# Patient Record
Sex: Female | Born: 1960 | Race: Black or African American | Hispanic: No
Health system: Southern US, Community
[De-identification: ages and names within clinical notes are randomized; demographics above are authoritative.]

---

## 2010-12-20 ENCOUNTER — Emergency Department (HOSPITAL_BASED_OUTPATIENT_CLINIC_OR_DEPARTMENT_OTHER)
Admission: EM | Admit: 2010-12-20 | Discharge: 2010-12-20 | Disposition: A | Discharge status: Home / Self Care | Payer: Managed Care, Other (non HMO) | Attending: Emergency Medicine | Admitting: Emergency Medicine

## 2010-12-20 ENCOUNTER — Emergency Department (INDEPENDENT_AMBULATORY_CARE_PROVIDER_SITE_OTHER): Payer: Managed Care, Other (non HMO)

## 2010-12-20 DIAGNOSIS — Z79899 Other long term (current) drug therapy: Secondary | ICD-10-CM | POA: Insufficient documentation

## 2010-12-20 DIAGNOSIS — M25569 Pain in unspecified knee: Secondary | ICD-10-CM

## 2010-12-20 DIAGNOSIS — I10 Essential (primary) hypertension: Secondary | ICD-10-CM | POA: Insufficient documentation

## 2010-12-20 DIAGNOSIS — F341 Dysthymic disorder: Secondary | ICD-10-CM | POA: Insufficient documentation

## 2012-02-12 IMAGING — CR DG KNEE 1-2V*L*
3 series · 3 of 3 positions shown · non-contrast
Comparison: None.

CLINICAL DATA: Knee pain, no known injury

LEFT KNEE - 1-2 VIEW

[t knee ap left]
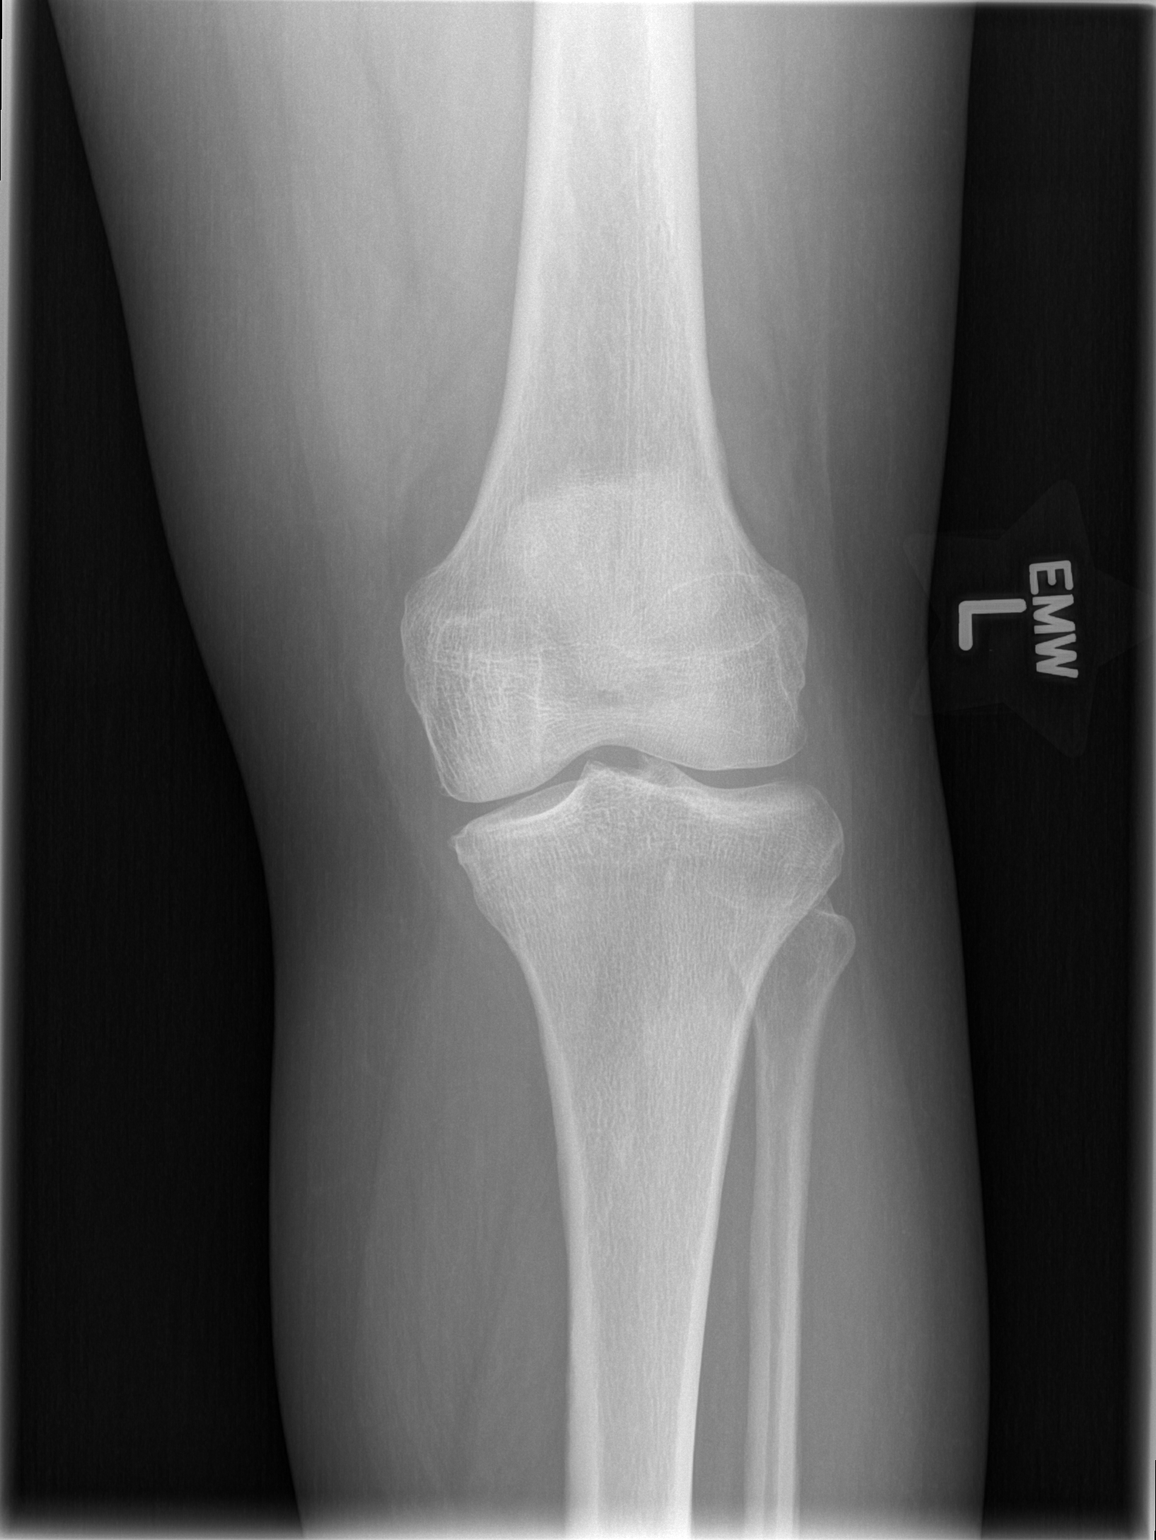

[t knee lat left (1 of 2)]
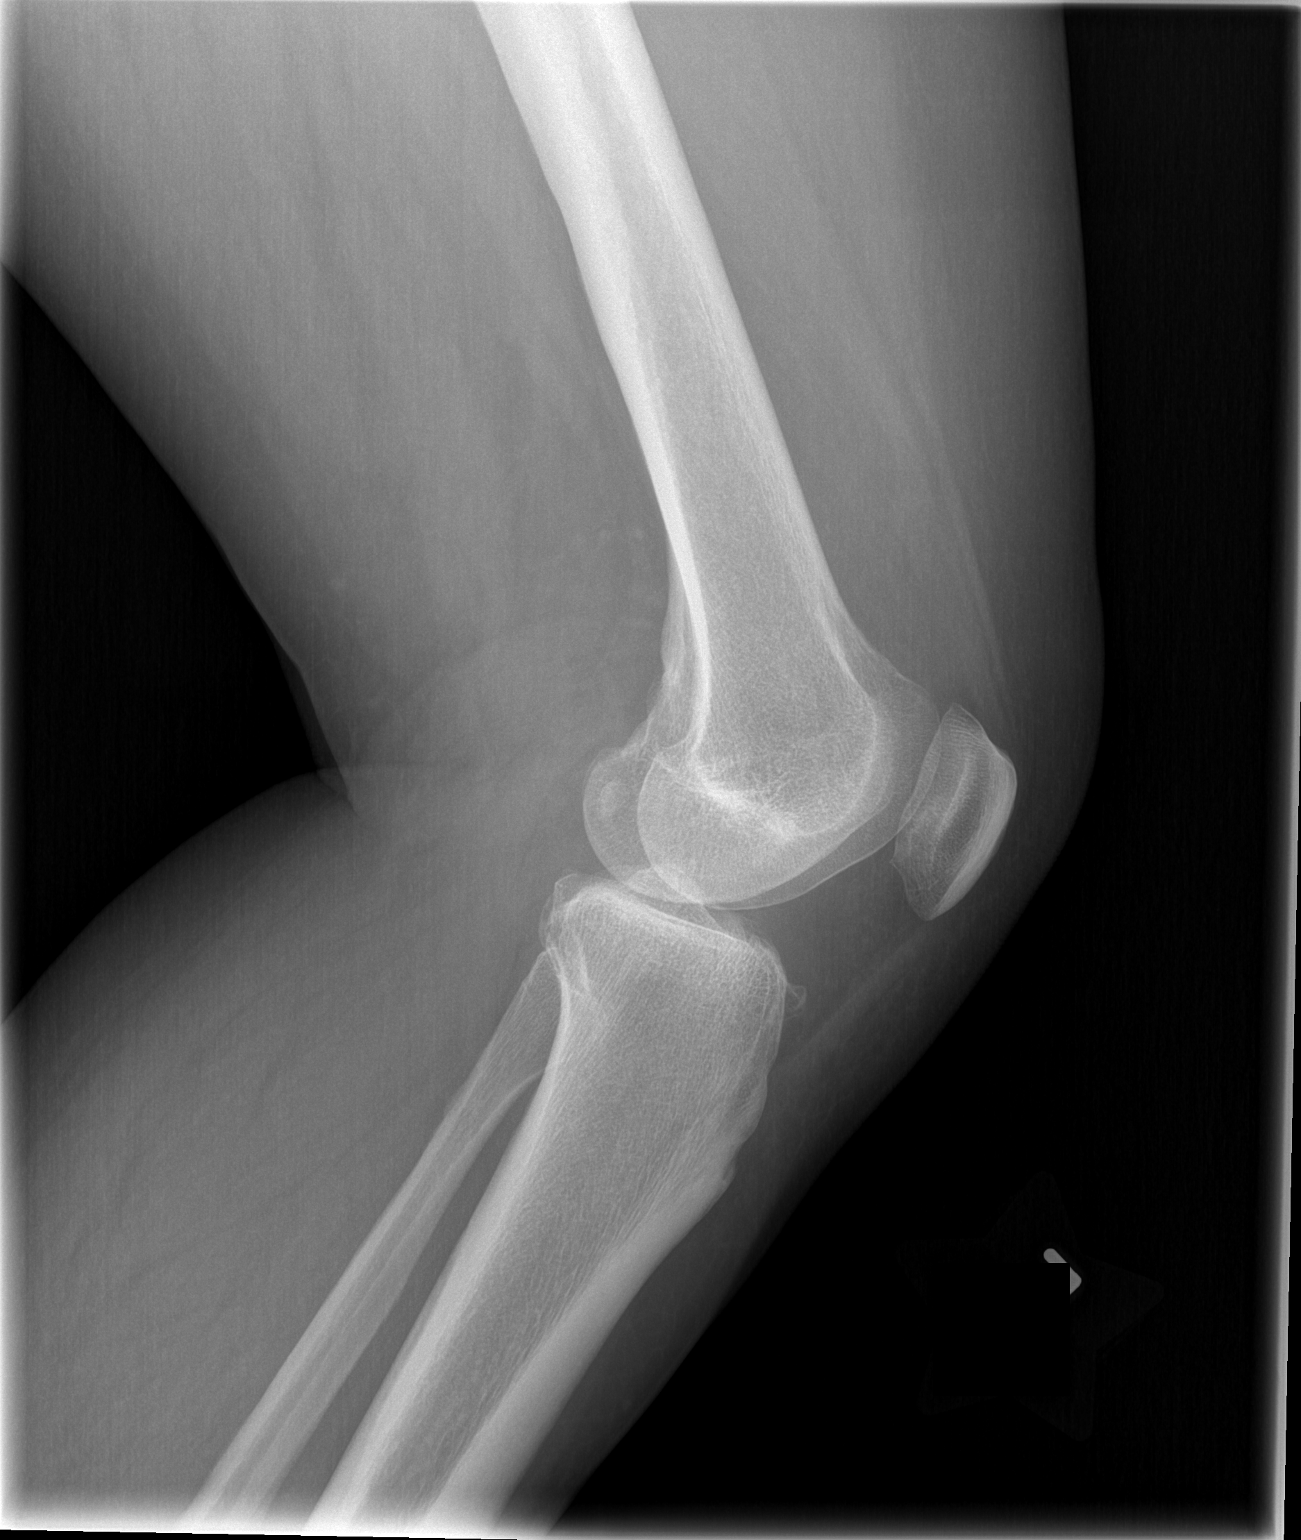

[t knee lat left (2 of 2)]
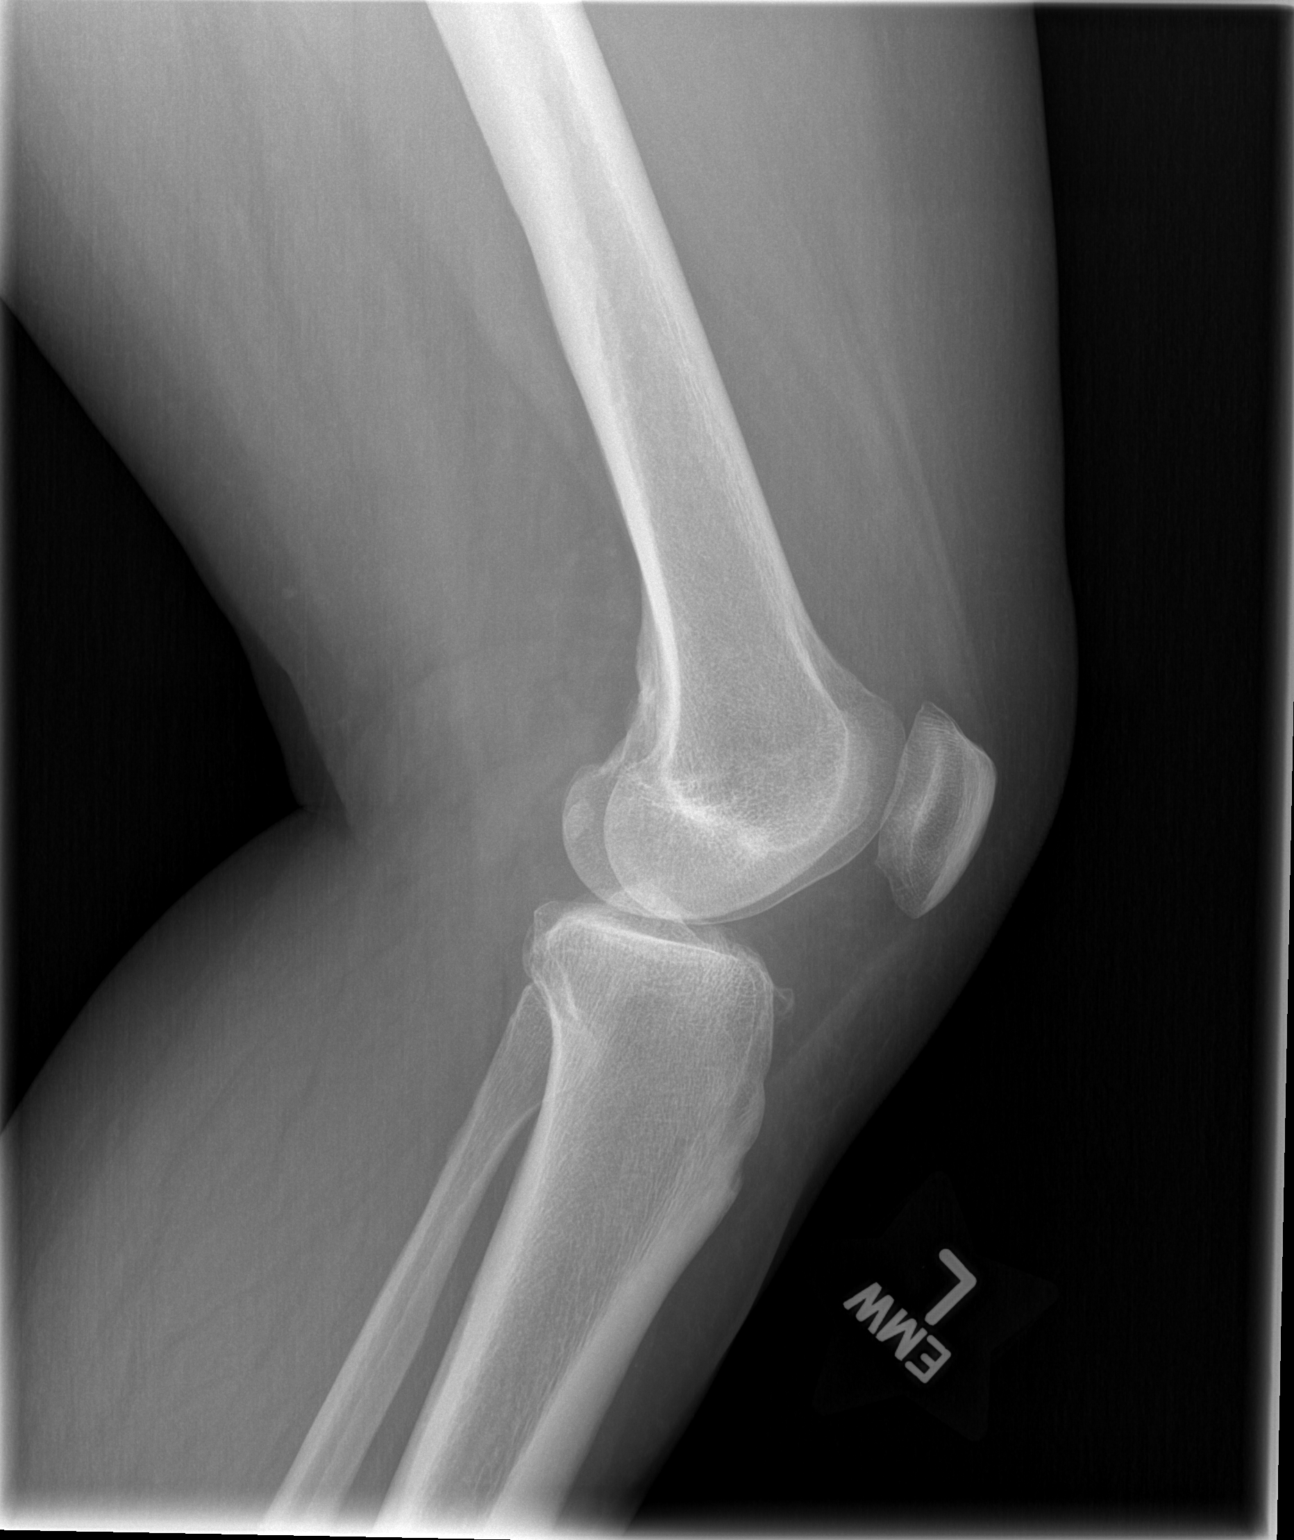

[3 of 3 positions shown; findings below may reference images not displayed]

FINDINGS: Three views of the left knee submitted.  No acute
fracture or subluxation.  No radiopaque foreign body.  No joint
effusion.
IMPRESSION: No acute fracture or subluxation.

## 2020-02-21 ENCOUNTER — Ambulatory Visit: Payer: Self-pay | Attending: Internal Medicine

## 2020-02-21 DIAGNOSIS — Z23 Encounter for immunization: Secondary | ICD-10-CM

## 2020-02-21 NOTE — Progress Notes (Signed)
   Covid-19 Vaccination Clinic  Name:  Rumaisa Schnetzer    MRN: [id number] DOB: 1960/11/09  02/21/2020  Ms. Dishman was observed post Covid-19 immunization for 15 minutes without incident. She was provided with Vaccine Information Sheet and instruction to access the V-Safe system.   Ms. Friedt was instructed to call 911 with any severe reactions post vaccine: Marland Kitchen Difficulty breathing  . Swelling of face and throat  . A fast heartbeat  . A bad rash all over body  . Dizziness and weakness   Immunizations Administered    Name Date Dose VIS Date Route   Pfizer COVID-19 Vaccine 02/21/2020  4:47 PM 0.3 mL 12/04/2018 Intramuscular   Manufacturer: [employer], [employer]   Lot: [id number]   NDC: [address]-4

## 2020-03-13 ENCOUNTER — Ambulatory Visit: Payer: Self-pay | Attending: Internal Medicine

## 2020-03-13 DIAGNOSIS — Z23 Encounter for immunization: Secondary | ICD-10-CM

## 2020-03-13 NOTE — Progress Notes (Signed)
   Covid-19 Vaccination Clinic  Name:  Loretta Campbell    MRN: [id number] DOB: [birth year]  03/13/2020  Loretta Campbell was observed post Covid-19 immunization for 15 minutes without incident. She was provided with Vaccine Information Sheet and instruction to access the V-Safe system.   Loretta Campbell was instructed to call 911 with any severe reactions post vaccine: Marland Kitchen Difficulty breathing  . Swelling of face and throat  . A fast heartbeat  . A bad rash all over body  . Dizziness and weakness   Immunizations Administered    Name Date Dose VIS Date Route   Pfizer COVID-19 Vaccine 03/13/2020  4:37 PM 0.3 mL 12/04/2018 Intramuscular   Manufacturer: [employer], [employer]   Lot: [id number]   NDC: [address]-7

## 2021-10-17 DIAGNOSIS — U071 COVID-19: Secondary | ICD-10-CM | POA: Diagnosis not present

## 2021-10-17 DIAGNOSIS — R509 Fever, unspecified: Secondary | ICD-10-CM | POA: Diagnosis not present

## 2021-10-17 DIAGNOSIS — Z20822 Contact with and (suspected) exposure to covid-19: Secondary | ICD-10-CM | POA: Diagnosis not present

## 2021-10-17 DIAGNOSIS — Z20828 Contact with and (suspected) exposure to other viral communicable diseases: Secondary | ICD-10-CM | POA: Diagnosis not present

## 2021-10-17 DIAGNOSIS — R0981 Nasal congestion: Secondary | ICD-10-CM | POA: Diagnosis not present

## 2021-12-31 DIAGNOSIS — Z124 Encounter for screening for malignant neoplasm of cervix: Secondary | ICD-10-CM | POA: Diagnosis not present

## 2021-12-31 DIAGNOSIS — I10 Essential (primary) hypertension: Secondary | ICD-10-CM | POA: Diagnosis not present

## 2021-12-31 DIAGNOSIS — E7801 Familial hypercholesterolemia: Secondary | ICD-10-CM | POA: Diagnosis not present

## 2021-12-31 DIAGNOSIS — E876 Hypokalemia: Secondary | ICD-10-CM | POA: Diagnosis not present

## 2021-12-31 DIAGNOSIS — R3129 Other microscopic hematuria: Secondary | ICD-10-CM | POA: Diagnosis not present

## 2021-12-31 DIAGNOSIS — R350 Frequency of micturition: Secondary | ICD-10-CM | POA: Diagnosis not present

## 2021-12-31 DIAGNOSIS — F331 Major depressive disorder, recurrent, moderate: Secondary | ICD-10-CM | POA: Diagnosis not present

## 2021-12-31 DIAGNOSIS — E559 Vitamin D deficiency, unspecified: Secondary | ICD-10-CM | POA: Diagnosis not present

## 2021-12-31 DIAGNOSIS — Z1231 Encounter for screening mammogram for malignant neoplasm of breast: Secondary | ICD-10-CM | POA: Diagnosis not present

## 2021-12-31 DIAGNOSIS — N390 Urinary tract infection, site not specified: Secondary | ICD-10-CM | POA: Diagnosis not present

## 2022-07-06 DIAGNOSIS — M545 Low back pain, unspecified: Secondary | ICD-10-CM | POA: Diagnosis not present

## 2022-07-06 DIAGNOSIS — I10 Essential (primary) hypertension: Secondary | ICD-10-CM | POA: Diagnosis not present

## 2022-07-06 DIAGNOSIS — Z124 Encounter for screening for malignant neoplasm of cervix: Secondary | ICD-10-CM | POA: Diagnosis not present

## 2022-07-06 DIAGNOSIS — G8929 Other chronic pain: Secondary | ICD-10-CM | POA: Diagnosis not present

## 2022-07-06 DIAGNOSIS — F331 Major depressive disorder, recurrent, moderate: Secondary | ICD-10-CM | POA: Diagnosis not present

## 2022-07-06 DIAGNOSIS — E559 Vitamin D deficiency, unspecified: Secondary | ICD-10-CM | POA: Diagnosis not present

## 2022-07-06 DIAGNOSIS — M25562 Pain in left knee: Secondary | ICD-10-CM | POA: Diagnosis not present

## 2022-07-17 DIAGNOSIS — Z03818 Encounter for observation for suspected exposure to other biological agents ruled out: Secondary | ICD-10-CM | POA: Diagnosis not present

## 2022-07-17 DIAGNOSIS — Z20822 Contact with and (suspected) exposure to covid-19: Secondary | ICD-10-CM | POA: Diagnosis not present

## 2022-09-06 DIAGNOSIS — M25562 Pain in left knee: Secondary | ICD-10-CM | POA: Diagnosis not present

## 2022-09-06 DIAGNOSIS — M1712 Unilateral primary osteoarthritis, left knee: Secondary | ICD-10-CM | POA: Diagnosis not present

## 2022-09-08 DIAGNOSIS — M1712 Unilateral primary osteoarthritis, left knee: Secondary | ICD-10-CM | POA: Diagnosis not present

## 2022-11-08 DIAGNOSIS — E559 Vitamin D deficiency, unspecified: Secondary | ICD-10-CM | POA: Diagnosis not present

## 2022-11-08 DIAGNOSIS — E7801 Familial hypercholesterolemia: Secondary | ICD-10-CM | POA: Diagnosis not present

## 2022-11-08 DIAGNOSIS — M25512 Pain in left shoulder: Secondary | ICD-10-CM | POA: Diagnosis not present

## 2022-11-08 DIAGNOSIS — I10 Essential (primary) hypertension: Secondary | ICD-10-CM | POA: Diagnosis not present

## 2022-11-08 DIAGNOSIS — E876 Hypokalemia: Secondary | ICD-10-CM | POA: Diagnosis not present

## 2022-11-08 DIAGNOSIS — R339 Retention of urine, unspecified: Secondary | ICD-10-CM | POA: Diagnosis not present

## 2022-12-29 DIAGNOSIS — Z1151 Encounter for screening for human papillomavirus (HPV): Secondary | ICD-10-CM | POA: Diagnosis not present

## 2022-12-29 DIAGNOSIS — Z01419 Encounter for gynecological examination (general) (routine) without abnormal findings: Secondary | ICD-10-CM | POA: Diagnosis not present

## 2023-01-19 DIAGNOSIS — H18413 Arcus senilis, bilateral: Secondary | ICD-10-CM | POA: Diagnosis not present

## 2023-01-19 DIAGNOSIS — H2513 Age-related nuclear cataract, bilateral: Secondary | ICD-10-CM | POA: Diagnosis not present

## 2023-01-19 DIAGNOSIS — H35033 Hypertensive retinopathy, bilateral: Secondary | ICD-10-CM | POA: Diagnosis not present

## 2023-01-19 DIAGNOSIS — H40023 Open angle with borderline findings, high risk, bilateral: Secondary | ICD-10-CM | POA: Diagnosis not present

## 2023-02-13 DIAGNOSIS — M1712 Unilateral primary osteoarthritis, left knee: Secondary | ICD-10-CM | POA: Diagnosis not present

## 2023-02-16 DIAGNOSIS — M1712 Unilateral primary osteoarthritis, left knee: Secondary | ICD-10-CM | POA: Diagnosis not present

## 2023-03-14 DIAGNOSIS — I10 Essential (primary) hypertension: Secondary | ICD-10-CM | POA: Diagnosis not present

## 2023-03-14 DIAGNOSIS — F3341 Major depressive disorder, recurrent, in partial remission: Secondary | ICD-10-CM | POA: Diagnosis not present

## 2023-03-14 DIAGNOSIS — R600 Localized edema: Secondary | ICD-10-CM | POA: Diagnosis not present

## 2023-04-18 DIAGNOSIS — T300 Burn of unspecified body region, unspecified degree: Secondary | ICD-10-CM | POA: Diagnosis not present

## 2023-04-18 DIAGNOSIS — Z23 Encounter for immunization: Secondary | ICD-10-CM | POA: Diagnosis not present

## 2023-05-03 DIAGNOSIS — M7989 Other specified soft tissue disorders: Secondary | ICD-10-CM | POA: Diagnosis not present

## 2023-05-03 DIAGNOSIS — T300 Burn of unspecified body region, unspecified degree: Secondary | ICD-10-CM | POA: Diagnosis not present

## 2023-10-19 DIAGNOSIS — F331 Major depressive disorder, recurrent, moderate: Secondary | ICD-10-CM | POA: Diagnosis not present

## 2023-10-19 DIAGNOSIS — F5105 Insomnia due to other mental disorder: Secondary | ICD-10-CM | POA: Diagnosis not present

## 2023-10-19 DIAGNOSIS — F3341 Major depressive disorder, recurrent, in partial remission: Secondary | ICD-10-CM | POA: Diagnosis not present

## 2023-10-19 DIAGNOSIS — I10 Essential (primary) hypertension: Secondary | ICD-10-CM | POA: Diagnosis not present

## 2023-10-19 DIAGNOSIS — Z131 Encounter for screening for diabetes mellitus: Secondary | ICD-10-CM | POA: Diagnosis not present

## 2023-10-19 DIAGNOSIS — R3911 Hesitancy of micturition: Secondary | ICD-10-CM | POA: Diagnosis not present

## 2023-10-19 DIAGNOSIS — H6992 Unspecified Eustachian tube disorder, left ear: Secondary | ICD-10-CM | POA: Diagnosis not present

## 2023-10-19 DIAGNOSIS — Z13220 Encounter for screening for lipoid disorders: Secondary | ICD-10-CM | POA: Diagnosis not present

## 2024-01-02 DIAGNOSIS — Z01419 Encounter for gynecological examination (general) (routine) without abnormal findings: Secondary | ICD-10-CM | POA: Diagnosis not present

## 2024-01-02 DIAGNOSIS — R350 Frequency of micturition: Secondary | ICD-10-CM | POA: Diagnosis not present
